# Patient Record
Sex: Male | Born: 1955 | Race: White | Hispanic: No | Marital: Single | Smoking: Never smoker
Health system: Southern US, Community
[De-identification: ages and names within clinical notes are randomized; demographics above are authoritative.]

## PROBLEM LIST (undated history)

## (undated) DIAGNOSIS — T7840XA Allergy, unspecified, initial encounter: Secondary | ICD-10-CM

## (undated) DIAGNOSIS — E785 Hyperlipidemia, unspecified: Secondary | ICD-10-CM

## (undated) HISTORY — DX: Allergy, unspecified, initial encounter: T78.40XA

## (undated) HISTORY — DX: Hyperlipidemia, unspecified: E78.5

## (undated) HISTORY — PX: CERVICAL SPINE SURGERY: SHX589

---

## 2006-10-25 ENCOUNTER — Ambulatory Visit: Payer: Self-pay | Admitting: Gastroenterology

## 2011-05-12 ENCOUNTER — Ambulatory Visit: Payer: Self-pay | Admitting: Family Medicine

## 2011-06-15 ENCOUNTER — Ambulatory Visit: Payer: Self-pay | Admitting: Otolaryngology

## 2012-01-10 ENCOUNTER — Emergency Department: Payer: Self-pay | Admitting: *Deleted

## 2012-01-15 ENCOUNTER — Emergency Department: Payer: Self-pay | Admitting: Emergency Medicine

## 2016-01-05 ENCOUNTER — Emergency Department
Admission: EM | Admit: 2016-01-05 | Discharge: 2016-01-05 | Disposition: A | Discharge status: Home / Self Care | Payer: BLUE CROSS/BLUE SHIELD | Attending: Emergency Medicine | Admitting: Emergency Medicine

## 2016-01-05 ENCOUNTER — Encounter: Payer: Self-pay | Admitting: Emergency Medicine

## 2016-01-05 ENCOUNTER — Emergency Department: Payer: BLUE CROSS/BLUE SHIELD

## 2016-01-05 DIAGNOSIS — S43102A Unspecified dislocation of left acromioclavicular joint, initial encounter: Secondary | ICD-10-CM | POA: Diagnosis not present

## 2016-01-05 DIAGNOSIS — Y999 Unspecified external cause status: Secondary | ICD-10-CM | POA: Insufficient documentation

## 2016-01-05 DIAGNOSIS — Y9351 Activity, roller skating (inline) and skateboarding: Secondary | ICD-10-CM | POA: Diagnosis not present

## 2016-01-05 DIAGNOSIS — W1800XA Striking against unspecified object with subsequent fall, initial encounter: Secondary | ICD-10-CM | POA: Diagnosis not present

## 2016-01-05 DIAGNOSIS — Y929 Unspecified place or not applicable: Secondary | ICD-10-CM | POA: Diagnosis not present

## 2016-01-05 DIAGNOSIS — S4992XA Unspecified injury of left shoulder and upper arm, initial encounter: Secondary | ICD-10-CM | POA: Diagnosis present

## 2016-01-05 MED ORDER — OXYCODONE-ACETAMINOPHEN 5-325 MG PO TABS: 1.0000 | ORAL_TABLET | Freq: Four times a day (QID) | ORAL | Status: DC | PRN

## 2016-01-05 MED ORDER — MELOXICAM 15 MG PO TABS: 15.0000 mg | ORAL_TABLET | Freq: Every day | ORAL | Status: DC

## 2016-01-05 NOTE — ED Notes (Signed)
Pt states he was skating around 6pm tonight when he fell on th left side. Pt was wearing a helmet but did hit his head but states he hit his shoulder harder.  Denies pain right now and states it feels numb. And if he is still it doesn't hurt.

## 2016-01-05 NOTE — ED Provider Notes (Signed)
[facility] Emergency Department Provider Note ____________________________________________  Time seen: Approximately 7:00 PM  I have reviewed the triage vital signs and the nursing notes.   HISTORY  Chief Complaint Fall    HPI Jeremy James is a 60 y.o. male presents to the emergency department for evaluation of left shoulder pain. He was skating this evening and fell. He he was wearing a helmet and denies loss of consciousness although he did strike his head. He denies headache at this time. He states that he landed directly on his shoulder. He states that the shoulder feels numb in the areas of the abrasions.  History reviewed. No pertinent past medical history.  There are no active problems to display for this patient.   Past Surgical History  Procedure Laterality Date  . Cervical spine surgery      Current Outpatient Rx  Name  Route  Sig  Dispense  Refill  . meloxicam (MOBIC) 15 MG tablet   Oral   Take 1 tablet (15 mg total) by mouth daily.   30 tablet   0   . oxyCODONE-acetaminophen (ROXICET) 5-325 MG tablet   Oral   Take 1 tablet by mouth every 6 (six) hours as needed.   20 tablet   0     Allergies Review of patient's allergies indicates no known allergies.  No family history on file.  Social History Social History  Substance Use Topics  . Smoking status: Never Smoker   . Smokeless tobacco: None  . Alcohol Use: No    Review of Systems Constitutional: No recent illness. Cardiovascular: Denies chest pain or palpitations. Respiratory: Denies shortness of breath. Musculoskeletal: Pain in Left shoulder Skin: Negative for rash, wound, lesion. Neurological: Negative for focal weakness or numbness.  ____________________________________________   PHYSICAL EXAM:  VITAL SIGNS: ED Triage Vitals  Enc Vitals Group     BP 01/05/16 1833 129/89 mmHg     Pulse Rate 01/05/16 1833 60     Resp 01/05/16 1833 16     Temp 01/05/16  1833 98.1 F (36.7 C)     Temp Source 01/05/16 1833 Oral     SpO2 01/05/16 1833 97 %     Weight 01/05/16 1833 180 lb (81.647 kg)     Height 01/05/16 1833 6' (1.829 m)     Head Cir --      Peak Flow --      Pain Score 01/05/16 1836 0     Pain Loc --      Pain Edu? --      Excl. in GC? --     Constitutional: Alert and oriented. Well appearing and in no acute distress. Eyes: Conjunctivae are normal. EOMI. Head: Atraumatic. Neck: No stridor.  Respiratory: Normal respiratory effort.   Musculoskeletal: No step-off of the left shoulder on exam although there is very limited range of motion due to pain. There appears to be a deformity at the acromioclavicular joint. Neurologic:  Normal speech and language. No gross focal neurologic deficits are appreciated. Speech is normal. No gait instability. Patient with full range of motion of the elbow and hand. 2 point discrimination intact throughout. Skin:  Skin is warm, dry and intact. Abrasions noted about the left shoulder. Psychiatric: Mood and affect are normal. Speech and behavior are normal.  ____________________________________________   LABS (all labs ordered are listed, but only abnormal results are displayed)  Labs Reviewed - No data to display ____________________________________________  RADIOLOGY  Acromioclavicular joint widening of 9  mm with superior migration of the distal clavicle with respect to the acromion. Coracoclavicular joint space is widened at 31 mm. No definite associated fracture. Glenohumeral alignment is maintained, small glenoid osteophytes. No abnormal soft tissue calcifications. ____________________________________________   PROCEDURES  Procedure(s) performed:   Shoulder immobilizer applied by RN. Patient was neurovascularly intact post-application.   ____________________________________________   INITIAL IMPRESSION / ASSESSMENT AND PLAN / ED COURSE  Pertinent labs & imaging results that were  available during my care of the patient were reviewed by me and considered in my medical decision making (see chart for details).  Strict return precautions discussed with the patient. He was advised to return to the ER if the "numbness" is no longer localized over the abrasions and travels down the arm.  Patient to call orthopedics tomorrow to schedule a follow up. He is to wear the shoulder immobilizer until his follow up is scheduled. He is to return to the ER for symptoms that change or worsen if unable to see the orthopedist right away. ____________________________________________   FINAL CLINICAL IMPRESSION(S) / ED DIAGNOSES  Final diagnoses:  Acromioclavicular joint separation, left, initial encounter       Chinita PesterCari B Aalyssa Elderkin, FNP 01/05/16 2043  Sharyn CreamerMark Quale, MD 01/05/16 [phone]

## 2016-01-05 NOTE — Discharge Instructions (Signed)
Shoulder Separation  A shoulder separation (acromioclavicular separation) is an injury to the connecting tissue (ligament) between the top of your shoulder blade (acromion) and your collarbone (clavicle).  The ligament may be stretched, partially torn, or completely torn.   A stretched ligament may not cause very much pain, and it does not move the collarbone out of place. A stretched ligament looks normal on an X-ray.   An injury that is a bit worse may partially tear a ligament and move the collarbone slightly out of place.   A serious injury completely tears both shoulder ligaments. This moves the collarbone severely out of position and changes the way that the shoulder looks (deformity).  CAUSES  The most common cause of a shoulder separation is falling on or receiving a blow to the top of the shoulder. Falling with an outstretched arm may also cause this injury.  RISK FACTORS  You may be at greater risk of a shoulder separation if:   You are male.   You are younger than age 35.   You play a contact sport, such as football or hockey.  SIGNS AND SYMPTOMS  The most common symptom of a shoulder separation is pain on the top of the shoulder after falling on it or receiving a blow to it. Other signs and symptoms include:   Shoulder deformity.   Swelling of the shoulder.   Decreased ability to move the shoulder.   Bruising on top of the shoulder.  DIAGNOSIS  Your health care provider may suspect a shoulder separation based on your symptoms and the details of a recent injury. A physical exam will be done. During this exam, the health care provider may:   Press on your shoulder.   Test the movement of your shoulder.   Ask you to hold a weight in your hand to see if the separation increases.   Do an X-ray.  TREATMENT   A stretch injury may require only a sling, pain medicine, and cold packs. This treatment may last for 2-12 weeks. You may also have physical therapy. A physical therapist will teach you to  do daily exercises to strengthen your shoulder muscles and prevent stiffness.   A complete tear may require surgery to repair the torn ligament. After surgery, you will also require a sling, pain medicine, and cold packs. Recovery may take longer. You may also need more physical therapy.  HOME CARE INSTRUCTIONS   Take medicines only as directed by your health care provider.   Apply ice to the top of your shoulder:   Put ice in a plastic bag.   Place a towel between your skin and the bag.   Leave the ice on for 20 minutes, 2-3 times a day.   Wear your sling or splint as directed by your health care provider.   You may be able to remove your sling to do your physical therapy exercises.   Ask your health care provider when you can stop wearing the sling.   Do not do any activities that make your pain worse.   Do not lift anything that is heavier than 10 lb (4.5 kg) on the injured side of your body.   Ask your health care provider when you can return to athletic activities.  SEEK MEDICAL CARE IF:   Your pain medicine is not relieving your pain.   Your pain and stiffness are not improving after 2 weeks.   You are unable to do your physical therapy exercises because   of pain or stiffness.     This information is not intended to replace advice given to you by your health care provider. Make sure you discuss any questions you have with your health care provider.     Document Released: 04/01/2005 Document Revised: 07/13/2014 Document Reviewed: 11/22/2013  Elsevier Interactive Patient Education 2016 [employer]

## 2016-01-05 NOTE — ED Notes (Signed)
Patient to the ED via POV for complaint of left shoulder pain s/p fall. Patient states that about 30 minutes he was speed skating and fell landing on the left side. Patient states that he may have hit his head but did not pass out. Patient had helmet on.

## 2017-10-18 ENCOUNTER — Other Ambulatory Visit: Payer: Self-pay

## 2017-10-18 ENCOUNTER — Emergency Department: Payer: BLUE CROSS/BLUE SHIELD

## 2017-10-18 ENCOUNTER — Emergency Department
Admission: EM | Admit: 2017-10-18 | Discharge: 2017-10-18 | Disposition: A | Discharge status: Home / Self Care | Payer: BLUE CROSS/BLUE SHIELD | Attending: Emergency Medicine | Admitting: Emergency Medicine

## 2017-10-18 ENCOUNTER — Encounter: Payer: Self-pay | Admitting: Emergency Medicine

## 2017-10-18 DIAGNOSIS — I8001 Phlebitis and thrombophlebitis of superficial vessels of right lower extremity: Secondary | ICD-10-CM | POA: Insufficient documentation

## 2017-10-18 DIAGNOSIS — Z7901 Long term (current) use of anticoagulants: Secondary | ICD-10-CM | POA: Insufficient documentation

## 2017-10-18 DIAGNOSIS — Z79899 Other long term (current) drug therapy: Secondary | ICD-10-CM | POA: Insufficient documentation

## 2017-10-18 DIAGNOSIS — M79604 Pain in right leg: Secondary | ICD-10-CM | POA: Diagnosis present

## 2017-10-18 LAB — BASIC METABOLIC PANEL
ANION GAP: 4 — AB (ref 5–15)
BUN: 16 mg/dL (ref 6–20)
CHLORIDE: 106 mmol/L (ref 101–111)
CO2: 28 mmol/L (ref 22–32)
Calcium: 9.2 mg/dL (ref 8.9–10.3)
Creatinine, Ser: 0.65 mg/dL (ref 0.61–1.24)
GFR calc non Af Amer: >60 mL/min (ref >60)
Glucose, Bld: 90 mg/dL (ref 65–99)
POTASSIUM: 3.8 mmol/L (ref 3.5–5.1)
Sodium: 138 mmol/L (ref 135–145)

## 2017-10-18 LAB — CBC WITH DIFFERENTIAL/PLATELET
BASOS ABS: 0 10*3/uL (ref 0–0.1)
Basophils Relative: 1 %
Eosinophils Absolute: 0.1 10*3/uL (ref 0–0.7)
Eosinophils Relative: 3 %
HEMATOCRIT: 39 % — AB (ref 40.0–52.0)
Hemoglobin: 13.8 g/dL (ref 13.0–18.0)
LYMPHS PCT: 35 %
Lymphs Abs: 2 10*3/uL (ref 1.0–3.6)
MCH: 33.7 pg (ref 26.0–34.0)
MCHC: 35.4 g/dL (ref 32.0–36.0)
MCV: 95 fL (ref 80.0–100.0)
Monocytes Absolute: 0.7 10*3/uL (ref 0.2–1.0)
Monocytes Relative: 12 %
NEUTROS ABS: 2.8 10*3/uL (ref 1.4–6.5)
NEUTROS PCT: 49 %
Platelets: 204 10*3/uL (ref 150–440)
RBC: 4.1 MIL/uL — AB (ref 4.40–5.90)
RDW: 13 % (ref 11.5–14.5)
WBC: 5.5 10*3/uL (ref 3.8–10.6)

## 2017-10-18 LAB — PROTIME-INR
INR: 0.96
Prothrombin Time: 12.7 seconds (ref 11.4–15.2)

## 2017-10-18 MED ORDER — APIXABAN 5 MG PO TABS
10.0000 mg | ORAL_TABLET | Freq: Once | ORAL | Status: AC
  Administered 2017-10-18: 10 mg via ORAL
  Filled 2017-10-18: qty 2

## 2017-10-18 MED ORDER — APIXABAN 5 MG PO TABS: ORAL_TABLET | ORAL | 0 refills | Status: DC

## 2017-10-18 NOTE — Discharge Instructions (Addendum)
You have a clot in a superficial vein in your leg (the saphenous vein), however it is a fairly large clot and extends almost to the deep veins so we are giving you a blood thinner to treat it the same way as a deep vein thrombosis.  Take the blood thinner as prescribed.  Make an appointment to follow-up with your primary care doctor within the next 1-2 weeks.  Consult your primary care doctor to decide on the appropriate time to return to physical activity.  Return to the ER for new, worsening, or persistent pain or swelling, difficulty walking, shortness of breath, chest pain, or any other new or worsening symptoms that concern you.

## 2017-10-18 NOTE — ED Notes (Signed)
Esign not working pt verbalized discharge instructions and has no questions at this time 

## 2017-10-18 NOTE — ED Triage Notes (Signed)
Right lower leg pain x 2 weeks, since colonoscopy.  Sent by PCP to evaluate for DVT.

## 2017-10-18 NOTE — ED Notes (Signed)
Pt has slight swelling and tenderness not to right leg

## 2017-10-18 NOTE — ED Provider Notes (Signed)
[facility] Emergency Department Provider Note ____________________________________________   First MD Initiated Contact with Patient 10/18/17 1743     (approximate)  I have reviewed the triage vital signs and the nursing notes.   HISTORY  Chief Complaint Leg Pain    HPI Jeremy James is a 62 y.o. male with PMH as noted below who presents with right lower extremity swelling and pain for the last few weeks, occurring after he had a colonoscopy, described by him as a "varicose vein" and primarily localized to an area to the right lower leg just past the knee.  No history of trauma.  Patient was referred to the emergency department for evaluation for DVT.  History reviewed. No pertinent past medical history.  There are no active problems to display for this patient.   Past Surgical History:  Procedure Laterality Date  . CERVICAL SPINE SURGERY      Prior to Admission medications   Medication Sig Start Date End Date Taking? Authorizing Provider  apixaban (ELIQUIS) 5 MG TABS tablet Take 2 tablets (10 mg total) by mouth 2 (two) times daily for 7 days, THEN 1 tablet (5 mg total) 2 (two) times daily for 21 days. 10/19/17 11/16/17  Dionne BucySiadecki, Laquashia Mergenthaler, MD  meloxicam (MOBIC) 15 MG tablet Take 1 tablet (15 mg total) by mouth daily. 01/05/16   Triplett, Cari B, FNP  oxyCODONE-acetaminophen (ROXICET) 5-325 MG tablet Take 1 tablet by mouth every 6 (six) hours as needed. 01/05/16   Chinita Pesterriplett, Cari B, FNP    Allergies Patient has no known allergies.  No family history on file.  Social History Social History   Tobacco Use  . Smoking status: Never Smoker  . Smokeless tobacco: Never Used  Substance Use Topics  . Alcohol use: No  . Drug use: Not on file    Review of Systems  Constitutional: No fever/chills. Cardiovascular: Denies chest pain. Respiratory: Denies shortness of breath. Gastrointestinal: No vomiting. Musculoskeletal: Positive for right lower leg  pain. Skin: Negative for rash. Neurological: Negative for focal weakness or numbness.   ____________________________________________   PHYSICAL EXAM:  VITAL SIGNS: ED Triage Vitals  Enc Vitals Group     BP 10/18/17 1506 (!) 150/71     Pulse Rate 10/18/17 1506 (!) 54     Resp 10/18/17 1506 16     Temp 10/18/17 1506 97.9 F (36.6 C)     Temp Source 10/18/17 1506 Oral     SpO2 10/18/17 1506 97 %     Weight 10/18/17 1504 182 lb (82.6 kg)     Height 10/18/17 1504 6' (1.829 m)     Head Circumference --      Peak Flow --      Pain Score 10/18/17 1503 2     Pain Loc --      Pain Edu? --      Excl. in GC? --     Constitutional: Alert and oriented. Well appearing and in no acute distress. Eyes: Conjunctivae are normal.  Head: Atraumatic. Nose: No congestion/rhinnorhea. Mouth/Throat: Mucous membranes are moist.   Neck: Normal range of motion.  Cardiovascular: Good peripheral circulation. Respiratory: Normal respiratory effort.   Gastrointestinal: No distention.  Musculoskeletal: No lower extremity edema.  Extremities warm and well perfused.  Right lower leg with approximately 5 cm area of mild swelling and tenderness just distal to the knee in the medial aspect of the leg.  No popliteal or proximal leg swelling or tenderness.   Neurologic:  Normal  speech and language. No gross focal neurologic deficits are appreciated.  Skin:  Skin is warm and dry. No rash noted. Psychiatric: Mood and affect are normal. Speech and behavior are normal.  ____________________________________________   LABS (all labs ordered are listed, but only abnormal results are displayed)  Labs Reviewed  BASIC METABOLIC PANEL - Abnormal; Notable for the following components:      Result Value   Anion gap 4 (*)    All other components within normal limits  CBC WITH DIFFERENTIAL/PLATELET - Abnormal; Notable for the following components:   RBC 4.10 (*)    HCT 39.0 (*)    All other components within normal  limits  PROTIME-INR   ____________________________________________  EKG   ____________________________________________  RADIOLOGY  [place] RLE: superficial thrombophlebitis of the right great saphenous vein extending to the saphenofemoral junction  ____________________________________________   PROCEDURES  Procedure(s) performed: No  Procedures  Critical Care performed: No ____________________________________________   INITIAL IMPRESSION / ASSESSMENT AND PLAN / ED COURSE  Pertinent labs & imaging results that were available during my care of the patient were reviewed by me and considered in my medical decision making (see chart for details).  62 year old male with PMH as noted above presents with right lower extremity pain for the last several weeks after having colonoscopy, concern for possible DVT.  On exam, the patient is well-appearing, vital signs are normal except for hypertension, and the remainder the exam is as described above.  Ultrasound shows superficial thrombophlebitis of the saphenous vein extending to the saphenofemoral junction.  Although this is a superficial vein, given that it is relatively extensive and approaches the deep venous system, based on increased risk of VT E, anticoagulation is recommended.  There is no evidence of infection or indication for antibiotics.  Plan: Baseline labs, and discharge with prescription for Eliquis and primary care follow-up.  Clinical Course as of Oct 19 1931  Mon Oct 18, 2017  [address] [facility]: 33.7 [SS]    Clinical Course User Index [SS] Dionne Bucy, MD   ----------------------------------------- 7:33 PM on 10/18/2017 -----------------------------------------  Lab workup is unremarkable.  Patient stable for discharge home.  He agrees to follow-up with his primary care doctor.  Return precautions given, and the patient expresses understanding.  ____________________________________________   FINAL CLINICAL  IMPRESSION(S) / ED DIAGNOSES  Final diagnoses:  Thrombophlebitis of superficial veins of right lower extremity      NEW MEDICATIONS STARTED DURING THIS VISIT:  New Prescriptions   APIXABAN (ELIQUIS) 5 MG TABS TABLET    Take 2 tablets (10 mg total) by mouth 2 (two) times daily for 7 days, THEN 1 tablet (5 mg total) 2 (two) times daily for 21 days.     Note:  This document was prepared using Dragon voice recognition software and may include unintentional dictation errors.    Dionne Bucy, MD 10/18/17 [phone]

## 2017-11-15 ENCOUNTER — Ambulatory Visit (INDEPENDENT_AMBULATORY_CARE_PROVIDER_SITE_OTHER): Payer: BLUE CROSS/BLUE SHIELD | Admitting: Vascular Surgery

## 2017-11-15 ENCOUNTER — Encounter (INDEPENDENT_AMBULATORY_CARE_PROVIDER_SITE_OTHER): Payer: Self-pay | Admitting: Vascular Surgery

## 2017-11-15 DIAGNOSIS — E782 Mixed hyperlipidemia: Secondary | ICD-10-CM | POA: Diagnosis not present

## 2017-11-15 DIAGNOSIS — I8001 Phlebitis and thrombophlebitis of superficial vessels of right lower extremity: Secondary | ICD-10-CM | POA: Diagnosis not present

## 2017-11-15 DIAGNOSIS — E785 Hyperlipidemia, unspecified: Secondary | ICD-10-CM | POA: Insufficient documentation

## 2017-11-15 DIAGNOSIS — M1990 Unspecified osteoarthritis, unspecified site: Secondary | ICD-10-CM | POA: Insufficient documentation

## 2017-11-15 DIAGNOSIS — M1991 Primary osteoarthritis, unspecified site: Secondary | ICD-10-CM | POA: Diagnosis not present

## 2017-11-15 NOTE — Progress Notes (Signed)
MRN : [id number]  Jeremy James is a 62 y.o. (1956/02/20) male who presents with chief complaint of No chief complaint on file. Marland Kitchen  History of Present Illness:   I am asked to evaluate the patient for superficial thrombophlebitis of the right leg.  Patient estimates this began 4 weeks ago.  He underwent elective colonoscopy and the following day noted pain in his right leg medially.  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  He has never had any problems.  They had not caused him pain in the past.  Because of the increasing pain tenderness and what he describes as the hardness he saw his primary physician.  He was subsequently referred to the emergency room where an ultrasound was obtained which demonstrated superficial thrombophlebitis of the great saphenous vein all the way up to the saphenofemoral junction.  Because of this he was started on Eliquis.  He states the Eliquis makes him feel somewhat lightheaded "almost like vertigo".  He is questioning how long he will have to be on Eliquis.  No outpatient medications have been marked as taking for the 11/15/17 encounter (Appointment) with Gilda Crease, Latina Craver, MD.    No past medical history on file.  Past Surgical History:  Procedure Laterality Date  . CERVICAL SPINE SURGERY      Social History Social History   Tobacco Use  . Smoking status: Never Smoker  . Smokeless tobacco: Never Used  Substance Use Topics  . Alcohol use: No  . Drug use: Not on file    Family History No family history on file. No family history of bleeding/clotting disorders, porphyria or autoimmune disease   No Known Allergies   REVIEW OF SYSTEMS (Negative unless checked)  Constitutional: Weight loss  Fever  Chills Cardiac: Chest pain   Chest pressure   Palpitations   Shortness of breath when laying flat   Shortness of breath with exertion. Vascular:   Pain in legs with walking   Pain in legs at rest  History of DVT   Phlebitis   Swelling in legs   Varicose veins   Non-healing ulcers Pulmonary:   Uses home oxygen   Productive cough   Hemoptysis   Wheeze  COPD   Asthma Neurologic:  Dizziness   Seizures   History of stroke   History of TIA  Aphasia   Vissual changes   Weakness or numbness in arm   Weakness or numbness in leg Musculoskeletal:   Joint swelling   Joint pain   Low back pain Hematologic:  Easy bruising  Easy bleeding   Hypercoagulable state   Anemic Gastrointestinal:  Diarrhea   Vomiting  Gastroesophageal reflux/heartburn   Difficulty swallowing. Genitourinary:  Chronic kidney disease   Difficult urination  Frequent urination   Blood in urine Skin:  Rashes   Ulcers  Psychological:  History of anxiety    History of major depression.  Physical Examination  There were no vitals filed for this visit. There is no height or weight on file to calculate BMI. Gen: WD/WN, NAD Head: [place]/AT, No temporalis wasting.  Ear/Nose/Throat: Hearing grossly intact, nares w/o erythema or drainage, poor dentition Eyes: PER, EOMI, sclera nonicteric.  Neck: Supple, no masses.  No bruit or JVD.  Pulmonary:  Good air movement, clear to auscultation bilaterally, no use of accessory muscles.  Cardiac: RRR, normal S1, S2, no Murmurs. Vascular: Large  varicosities of the medial right calf.  Easily palpable cord noted in the great saphenous vein distribution.  Mild to moderately tender.  Mild venous changes to the skin of the ankle on the right. Vessel Right Left  Radial Palpable Palpable  Gastrointestinal: soft, non-distended. No guarding/no peritoneal signs.  Musculoskeletal: M/S 5/5 throughout.  No deformity or atrophy.  Neurologic: CN 2-12 intact. Pain and light touch intact in extremities.  Symmetrical.  Speech is fluent. Motor exam as listed above. Psychiatric: Judgment  intact, Mood & affect appropriate for pt's clinical situation. Dermatologic: Mild venous rashes right ankle no ulcers noted.  No changes consistent with cellulitis. Lymph : No Cervical lymphadenopathy, no lichenification or skin changes of chronic lymphedema.  CBC Lab Results  Component Value Date   WBC 5.5 10/18/2017   HGB 13.8 10/18/2017   HCT 39.0 (L) 10/18/2017   MCV 95.0 10/18/2017   PLT 204 10/18/2017    BMET    Component Value Date/Time   NA 138 10/18/2017 1827   K 3.8 10/18/2017 1827   CL 106 10/18/2017 1827   CO2 28 10/18/2017 1827   GLUCOSE 90 10/18/2017 1827   BUN 16 10/18/2017 1827   CREATININE 0.65 10/18/2017 1827   CALCIUM 9.2 10/18/2017 1827   GFRNONAA >60 10/18/2017 1827   GFRAA >60 10/18/2017 1827   CrCl cannot be calculated (Patient's most recent lab result is older than the maximum 21 days allowed.).  COAG Lab Results  Component Value Date   INR 0.96 10/18/2017    Radiology US Venous Img Lower Unilateral Right  Result Date: 10/18/2017 CLINICAL DATA:  Pain x2 weeks post colonoscopy EXAM: RIGHT LOWER EXTREMITY VENOUS DOPPLER ULTRASOUND TECHNIQUE: Gray-scale sonography with compression, as well as color and duplex ultrasound, were performed to evaluate the deep venous system from the level of the common femoral vein through the popliteal and proximal calf veins. COMPARISON:  None FINDINGS: Normal compressibility of the common femoral, superficial femoral, and popliteal veins, as well as the proximal calf veins. No filling defects to suggest DVT on grayscale or color Doppler imaging. Doppler waveforms show normal direction of venous flow, normal respiratory phasicity and response to augmentation. There is occlusive thrombosis of the right great saphenous vein extending from above the ankle nearly to the saphenofemoral junction. Survey views of the contralateral common femoral vein are unremarkable. IMPRESSION: 1.  No evidence of RIGHT lower extremity deep vein  thrombosis. 2. Extensive superficial thrombophlebitis involving the right great saphenous vein. Electronically Signed   By: Corlis Leak M.D.   On: 10/18/2017 16:07    Assessment/Plan 1. Superficial thrombophlebitis of right leg Recommend:   No surgery or intervention at this point in time.  IVC filter is not indicated at present.  Patient's duplex ultrasound of the venous system shows STP from the calf upto the SFJ.  The patient is initiated on anticoagulation   Elevation was stressed.  I have had a long discussion with the patient regarding STP and post phlebitic changes such as swelling and why it  causes symptoms such as pain.  Various options for anticoagulation were reviewed with the patient given the lightheadedness he has been experiencing while on Eliquis.  At this point he has been on anti-coagulation for approximately 4 weeks and only has another 4 to 6 weeks to go.  Given this and the typical course that being on Coumadin entails he is elected to continue the Eliquis.  He is a competitive Education officer, museum as a hobby and we discussed  this in depth.  I informed him that doing some light workouts would be fine however competition but would be out of the question given the risk for falls and bleeding while on anticoagulation.  Again the discussion centered on the fact that there is nothing inherently wrong with skating but it where he to be at a risk for falling such as aggressive training or actual competition that that would then be contraindicated.  The patient will continue anticoagulation for now as there have not been any problems or complications at this point.  I will see him back in 1 month with a follow-up ultrasound and we will hopefully be able to stop his anticoagulation  - VAS [place] LOWER EXTREMITY VENOUS REFLUX; Future  2. Primary osteoarthritis, unspecified site Continue NSAID medications as already ordered, these medications have been reviewed and there are no changes at this  time.  Continued activity and therapy was stressed.   3. Mixed hyperlipidemia Continue statin as ordered and reviewed, no changes at this time   Levora Dredge, MD  11/15/2017 8:56 AM

## 2017-12-16 ENCOUNTER — Ambulatory Visit (INDEPENDENT_AMBULATORY_CARE_PROVIDER_SITE_OTHER): Payer: BLUE CROSS/BLUE SHIELD

## 2017-12-16 ENCOUNTER — Ambulatory Visit (INDEPENDENT_AMBULATORY_CARE_PROVIDER_SITE_OTHER): Payer: BLUE CROSS/BLUE SHIELD | Admitting: Vascular Surgery

## 2017-12-16 ENCOUNTER — Encounter (INDEPENDENT_AMBULATORY_CARE_PROVIDER_SITE_OTHER): Payer: Self-pay | Admitting: Vascular Surgery

## 2017-12-16 VITALS — BP 129/88 | HR 54 | Resp 12 | Ht 72.0 in | Wt 187.0 lb

## 2017-12-16 DIAGNOSIS — I8001 Phlebitis and thrombophlebitis of superficial vessels of right lower extremity: Secondary | ICD-10-CM

## 2017-12-16 DIAGNOSIS — E782 Mixed hyperlipidemia: Secondary | ICD-10-CM

## 2017-12-17 ENCOUNTER — Encounter (INDEPENDENT_AMBULATORY_CARE_PROVIDER_SITE_OTHER): Payer: Self-pay | Admitting: Vascular Surgery

## 2017-12-17 NOTE — Progress Notes (Signed)
MRN : [id number]  Jeremy James is a 62 y.o. (1956/04/20) male who presents with chief complaint of  Chief Complaint  Patient presents with  . Follow-up    1 month Right reflux f/u  .  History of Present Illness:    Patient returns for office for follow-up regarding right superficial thrombophlebitis of the right leg.  An ultrasound was obtained which demonstrated superficial thrombophlebitis of the great saphenous vein all the way up to the saphenofemoral junction.  Because of this he was started on Eliquis.  He states the Eliquis makes him feel somewhat lightheaded "almost like vertigo".    Over the past 4 weeks he has done well he has had no further problems with Eliquis.  The pain in his right leg is resolved.  The watching  Current Meds  Medication Sig  . apixaban (ELIQUIS) 5 MG TABS tablet Take 5 mg by mouth 2 (two) times daily.  . cetirizine (ZYRTEC) 10 MG tablet Take by mouth.  . Coconut Oil 1000 MG CAPS Take by mouth.  . diphenhydrAMINE (BENADRYL) 25 mg capsule Take by mouth.  . lovastatin (MEVACOR) 20 MG tablet Take by mouth.  . Melatonin 1 MG TABS Take by mouth.  . meloxicam (MOBIC) 15 MG tablet Take 1 tablet (15 mg total) by mouth daily.  . Omega-3 Fatty Acids (RA FISH OIL) 1000 MG CAPS Take by mouth.  . oxyCODONE-acetaminophen (ROXICET) 5-325 MG tablet Take 1 tablet by mouth every 6 (six) hours as needed.    Past Medical History:  Diagnosis Date  . Allergy   . Hyperlipidemia     Past Surgical History:  Procedure Laterality Date  . CERVICAL SPINE SURGERY      Social History Social History   Tobacco Use  . Smoking status: Never Smoker  . Smokeless tobacco: Never Used  Substance Use Topics  . Alcohol use: No  . Drug use: Not on file    Family History Family History  Problem Relation Age of Onset  . Hyperlipidemia Mother   . Alzheimer's disease Mother     No Known Allergies   REVIEW OF SYSTEMS (Negative unless checked)  Constitutional:  [] Weight loss  [] Fever  [] Chills Cardiac: [] Chest pain   [] Chest pressure   [] Palpitations   [] Shortness of breath when laying flat   [] Shortness of breath with exertion. Vascular:  [] Pain in legs with walking   [] Pain in legs at rest  [] History of DVT   [] Phlebitis   [x] Swelling in legs   [x] Varicose veins   [] Non-healing ulcers Pulmonary:   [] Uses home oxygen   [] Productive cough   [] Hemoptysis   [] Wheeze  [] COPD   [] Asthma Neurologic:  [] Dizziness   [] Seizures   [] History of stroke   [] History of TIA  [] Aphasia   [] Vissual changes   [] Weakness or numbness in arm   [] Weakness or numbness in leg Musculoskeletal:   [] Joint swelling   [] Joint pain   [] Low back pain Hematologic:  [] Easy bruising  [] Easy bleeding   [] Hypercoagulable state   [] Anemic Gastrointestinal:  [] Diarrhea   [] Vomiting  [] Gastroesophageal reflux/heartburn   [] Difficulty swallowing. Genitourinary:  [] Chronic kidney disease   [] Difficult urination  [] Frequent urination   [] Blood in urine Skin:  [] Rashes   [] Ulcers  Psychological:  [] History of anxiety   []  History of major depression.  Physical Examination  Vitals:   12/16/17 0902  BP: 129/88  Pulse: (!) 54  Resp: 12  Weight: 187 lb (84.8 kg)  Height: 6' (  1.829 m)   Body mass index is 25.36 kg/m. Gen: WD/WN, NAD Head: [place]/AT, No temporalis wasting.  Ear/Nose/Throat: Hearing grossly intact, nares w/o erythema or drainage Eyes: PER, EOMI, sclera nonicteric.  Neck: Supple, no large masses.   Pulmonary:  Good air movement, no audible wheezing bilaterally, no use of accessory muscles.  Cardiac: RRR, no JVD Vascular: Large varicosities with areas of palpable acute changes of the right lower extremity Vessel Right Left  Radial Palpable Palpable  PT Palpable Palpable  DP Palpable Palpable  Gastrointestinal: Non-distended. No guarding/no peritoneal signs.  Musculoskeletal: M/S 5/5 throughout.  No deformity or atrophy.  Neurologic: CN 2-12 intact. Symmetrical.  Speech is  fluent. Motor exam as listed above. Psychiatric: Judgment intact, Mood & affect appropriate for pt's clinical situation. Dermatologic: Mild venous rashes no ulcers noted.  No changes consistent with cellulitis. Lymph : No lichenification or skin changes of chronic lymphedema.  CBC Lab Results  Component Value Date   WBC 5.5 10/18/2017   HGB 13.8 10/18/2017   HCT 39.0 (L) 10/18/2017   MCV 95.0 10/18/2017   PLT 204 10/18/2017    BMET    Component Value Date/Time   NA 138 10/18/2017 1827   K 3.8 10/18/2017 1827   CL 106 10/18/2017 1827   CO2 28 10/18/2017 1827   GLUCOSE 90 10/18/2017 1827   BUN 16 10/18/2017 1827   CREATININE 0.65 10/18/2017 1827   CALCIUM 9.2 10/18/2017 1827   GFRNONAA >60 10/18/2017 1827   GFRAA >60 10/18/2017 1827   CrCl cannot be calculated (Patient's most recent lab result is older than the maximum 21 days allowed.).  COAG Lab Results  Component Value Date   INR 0.96 10/18/2017    Radiology No results found.   Assessment/Plan 1. Superficial thrombophlebitis of right leg Recommend:   No surgery or intervention at this point in time.  IVC filter is not indicated at present.  Patient's duplex ultrasound of the venous system shows  resolution of the thrombus near the saphenofemoral junction there is recanalization of the second to the no evidence of DVT [address] is.  Patient will discontinue his anticoagulation  On January elevation was stressed.   - VAS [place] LOWER EXTREMITY VENOUS REFLUX; Future  2. Primary osteoarthritis, unspecified site Continue NSAID medications as already ordered, these medications have been reviewed and there are no changes at this time.  Continued activity and therapy was stressed.   3. Mixed hyperlipidemia Continue statin as ordered and reviewed, no changes at this time    Levora DredgeGregory Schnier, MD  12/17/2017 7:52 AM

## 2018-06-23 ENCOUNTER — Ambulatory Visit (INDEPENDENT_AMBULATORY_CARE_PROVIDER_SITE_OTHER): Payer: BLUE CROSS/BLUE SHIELD | Admitting: Vascular Surgery

## 2018-06-23 ENCOUNTER — Encounter (INDEPENDENT_AMBULATORY_CARE_PROVIDER_SITE_OTHER): Payer: Self-pay | Admitting: Vascular Surgery

## 2018-06-23 ENCOUNTER — Ambulatory Visit (INDEPENDENT_AMBULATORY_CARE_PROVIDER_SITE_OTHER): Payer: BLUE CROSS/BLUE SHIELD

## 2018-06-23 VITALS — BP 129/77 | HR 54 | Resp 12 | Ht 72.0 in | Wt 192.8 lb

## 2018-06-23 DIAGNOSIS — M1991 Primary osteoarthritis, unspecified site: Secondary | ICD-10-CM | POA: Diagnosis not present

## 2018-06-23 DIAGNOSIS — E782 Mixed hyperlipidemia: Secondary | ICD-10-CM

## 2018-06-23 DIAGNOSIS — I8001 Phlebitis and thrombophlebitis of superficial vessels of right lower extremity: Secondary | ICD-10-CM

## 2018-06-23 NOTE — Progress Notes (Signed)
MRN : [id number]  Jeremy James is a 62 y.o. (1955/12/04) male who presents with chief complaint of No chief complaint on file. Marland Kitchen.  History of Present Illness:   Patient returns for office for 6 follow-up regarding right superficial thrombophlebitis of the right leg. An ultrasound was obtained which demonstrated superficial thrombophlebitis of the great saphenous vein all the way up to the saphenofemoral junction. Because of this he was started on Eliquis.  The pain in his right leg is resolved.  He stopped his Eliquis in June, 2019  No outpatient medications have been marked as taking for the 06/23/18 encounter (Appointment) with Gilda CreaseSchnier, Latina CraverGregory G, MD.    Past Medical History:  Diagnosis Date  . Allergy   . Hyperlipidemia     Past Surgical History:  Procedure Laterality Date  . CERVICAL SPINE SURGERY      Social History Social History   Tobacco Use  . Smoking status: Never Smoker  . Smokeless tobacco: Never Used  Substance Use Topics  . Alcohol use: No  . Drug use: Not on file    Family History Family History  Problem Relation Age of Onset  . Hyperlipidemia Mother   . Alzheimer's disease Mother     No Known Allergies   REVIEW OF SYSTEMS (Negative unless checked)  Constitutional: [] Weight loss  [] Fever  [] Chills Cardiac: [] Chest pain   [] Chest pressure   [] Palpitations   [] Shortness of breath when laying flat   [] Shortness of breath with exertion. Vascular:  [] Pain in legs with walking   [] Pain in legs at rest  [] History of DVT   [] Phlebitis   [x] Swelling in legs   [x] Varicose veins   [] Non-healing ulcers Pulmonary:   [] Uses home oxygen   [] Productive cough   [] Hemoptysis   [] Wheeze  [] COPD   [] Asthma Neurologic:  [] Dizziness   [] Seizures   [] History of stroke   [] History of TIA  [] Aphasia   [] Vissual changes   [] Weakness or numbness in arm   [] Weakness or numbness in leg Musculoskeletal:   [] Joint swelling   [] Joint pain   [] Low back pain Hematologic:   [] Easy bruising  [] Easy bleeding   [] Hypercoagulable state   [] Anemic Gastrointestinal:  [] Diarrhea   [] Vomiting  [] Gastroesophageal reflux/heartburn   [] Difficulty swallowing. Genitourinary:  [] Chronic kidney disease   [] Difficult urination  [] Frequent urination   [] Blood in urine Skin:  [] Rashes   [] Ulcers  Psychological:  [] History of anxiety   []  History of major depression.  Physical Examination  There were no vitals filed for this visit. There is no height or weight on file to calculate BMI. Gen: WD/WN, NAD Head: [place]/AT, No temporalis wasting.  Ear/Nose/Throat: Hearing grossly intact, nares w/o erythema or drainage Eyes: PER, EOMI, sclera nonicteric.  Neck: Supple, no large masses.   Pulmonary:  Good air movement, no audible wheezing bilaterally, no use of accessory muscles.  Cardiac: RRR, no JVD Vascular: Large varicosities present extensively greater than 10 mm right > left.  Mild venous stasis changes to the legs bilaterally.  2+ soft pitting edema Vessel Right Left  Radial Palpable Palpable  PT Palpable Palpable  DP Palpable Palpable  Gastrointestinal: Non-distended. No guarding/no peritoneal signs.  Musculoskeletal: M/S 5/5 throughout.  No deformity or atrophy.  Neurologic: CN 2-12 intact. Symmetrical.  Speech is fluent. Motor exam as listed above. Psychiatric: Judgment intact, Mood & affect appropriate for pt's clinical situation. Dermatologic: No rashes or ulcers noted.  No changes consistent with cellulitis. Lymph : No lichenification  or skin changes of chronic lymphedema.  CBC Lab Results  Component Value Date   WBC 5.5 10/18/2017   HGB 13.8 10/18/2017   HCT 39.0 (L) 10/18/2017   MCV 95.0 10/18/2017   PLT 204 10/18/2017    BMET    Component Value Date/Time   NA 138 10/18/2017 1827   K 3.8 10/18/2017 1827   CL 106 10/18/2017 1827   CO2 28 10/18/2017 1827   GLUCOSE 90 10/18/2017 1827   BUN 16 10/18/2017 1827   CREATININE 0.65 10/18/2017 1827   CALCIUM 9.2  10/18/2017 1827   GFRNONAA >60 10/18/2017 1827   GFRAA >60 10/18/2017 1827   CrCl cannot be calculated (Patient's most recent lab result is older than the maximum 21 days allowed.).  COAG Lab Results  Component Value Date   INR 0.96 10/18/2017    Radiology No results found.   Assessment/Plan 1. Superficial thrombophlebitis of right leg Recommend:  The patient is complaining of varicose veins.    I have had a long discussion with the patient regarding  varicose veins and why they cause symptoms.  Patient will begin wearing graduated compression stockings on a daily basis, beginning first thing in the morning and removing them in the evening. The patient is instructed specifically not to sleep in the stockings.    The patient  will also begin using over-the-counter analgesics such as Motrin 600 mg po TID to help control the symptoms as needed.    In addition, behavioral modification including elevation during the day will be initiated, utilizing a recliner was recommended.  The patient is also instructed to continue exercising such as walking 4-5 times per week.  At this time the patient wishes to continue conservative therapy and is not interested in more invasive treatments such as laser ablation and sclerotherapy.  The Patient will follow up PRN if the symptoms worsen.  2. Primary osteoarthritis, unspecified site Continue NSAID medications as already ordered, these medications have been reviewed and there are no changes at this time.  Continued activity and therapy was stressed.   3. Mixed hyperlipidemia Continue statin as ordered and reviewed, no changes at this time     Levora DredgeGregory , MD  06/23/2018 8:26 AM

## 2019-03-25 IMAGING — US US EXTREM LOW VENOUS*R*
1 series · 14 of 24 positions shown · non-contrast
Comparison: None

CLINICAL DATA: Pain x2 weeks post colonoscopy

EXAM:
RIGHT LOWER EXTREMITY VENOUS DOPPLER ULTRASOUND
TECHNIQUE: Gray-scale sonography with compression, as well as color and duplex
ultrasound, were performed to evaluate the deep venous system from
the level of the common femoral vein through the popliteal and
proximal calf veins.

[Series 1: us extrem low venous*right* · 0.07mm/px · 14 of 49 slices shown]
[im 1/49]
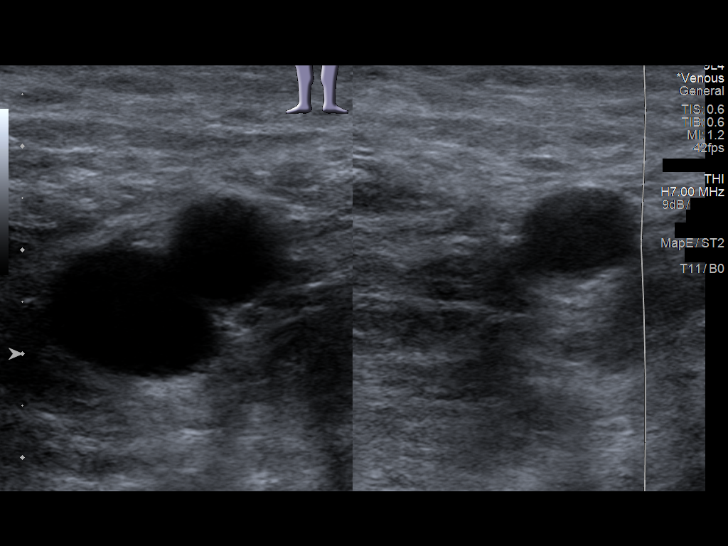
[im 5/49]
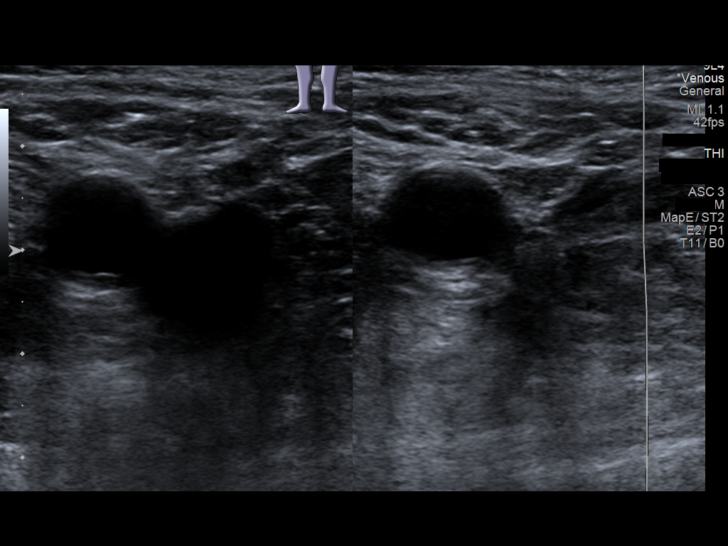
[im 9/49]
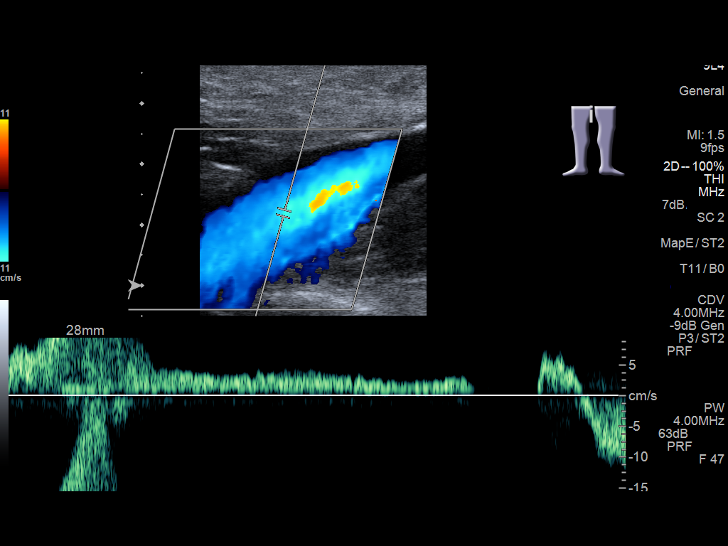
[im 13/49]
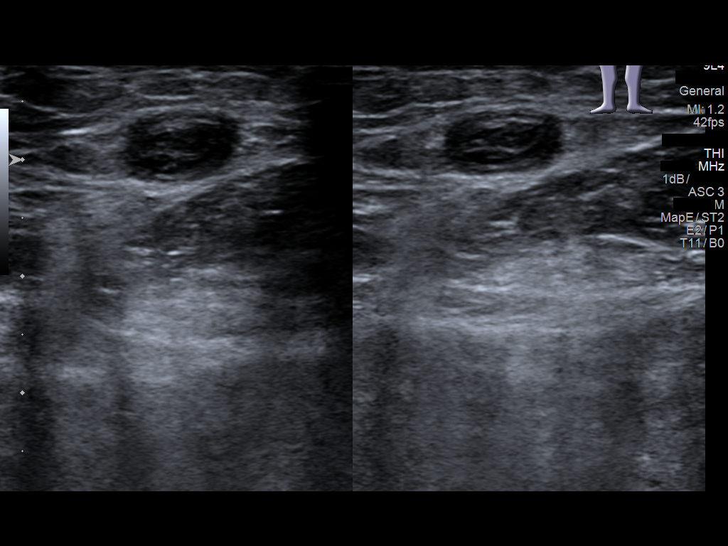
[im 15/49]
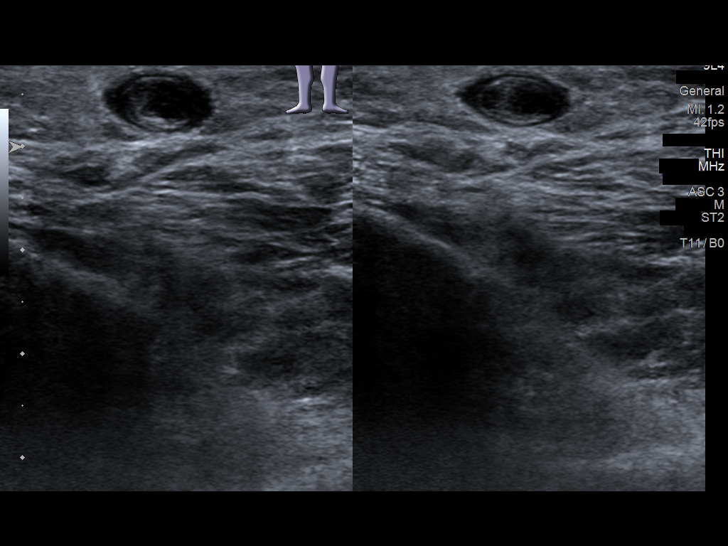
[im 19/49]
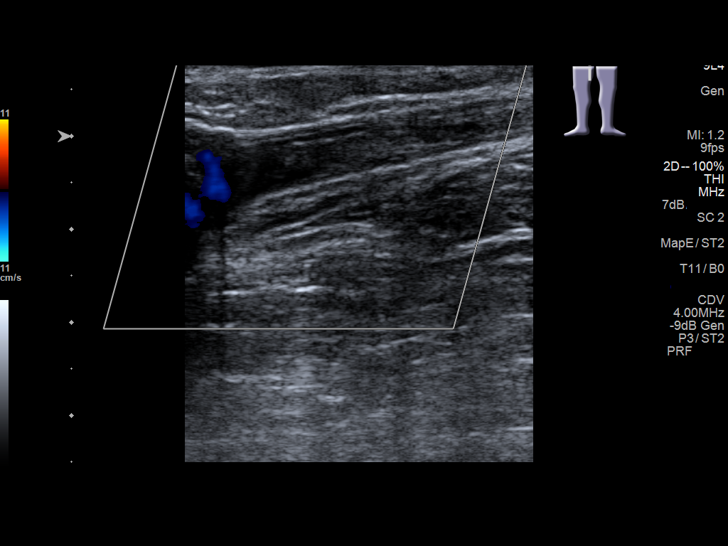
[im 23/49]
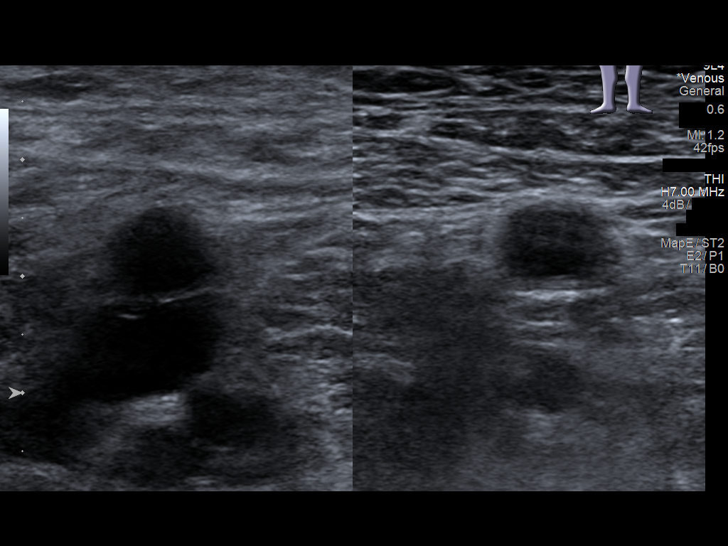
[im 26/49]
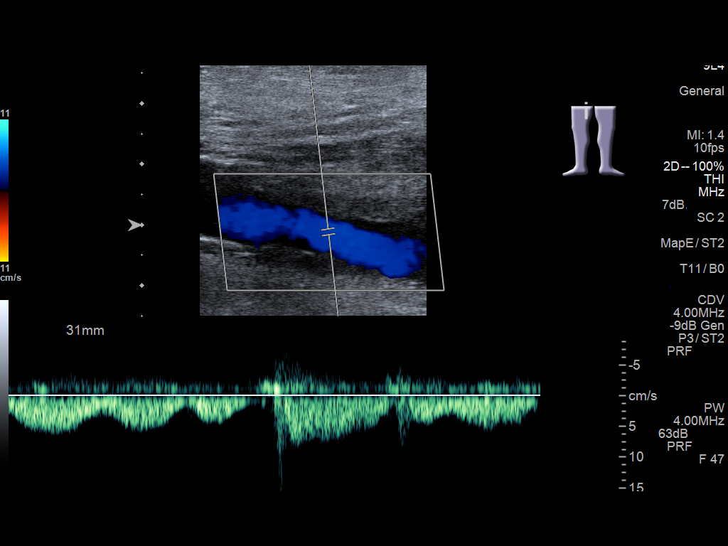
[im 30/49]
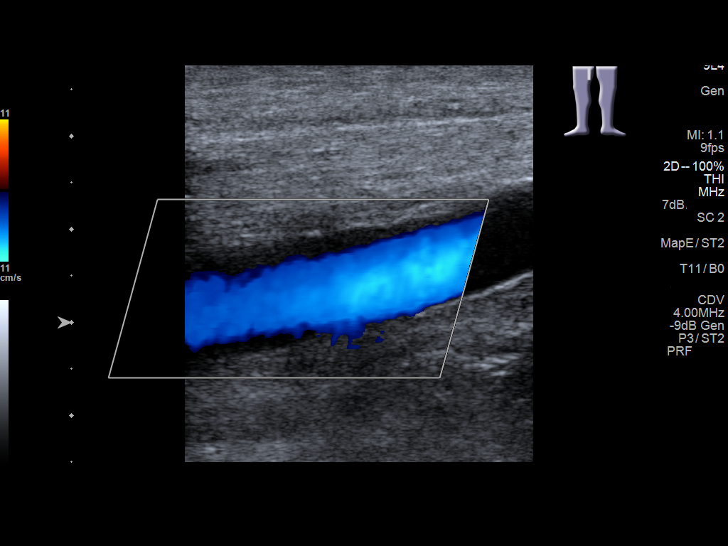
[im 34/49]
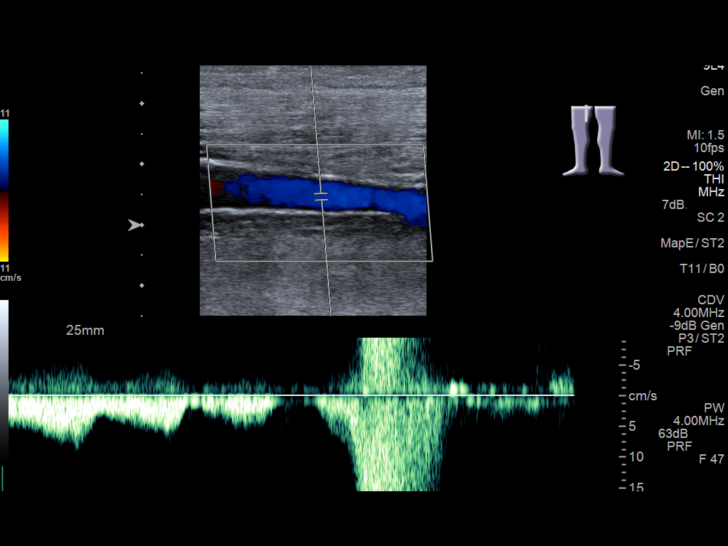
[im 38/49]
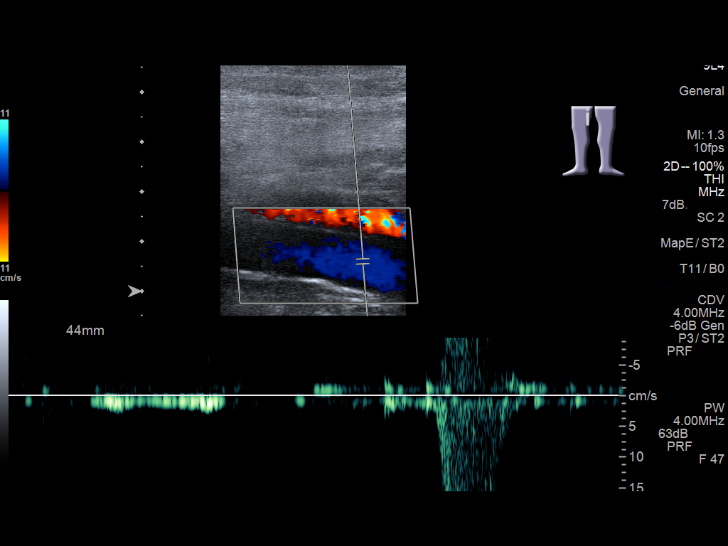
[im 40/49]
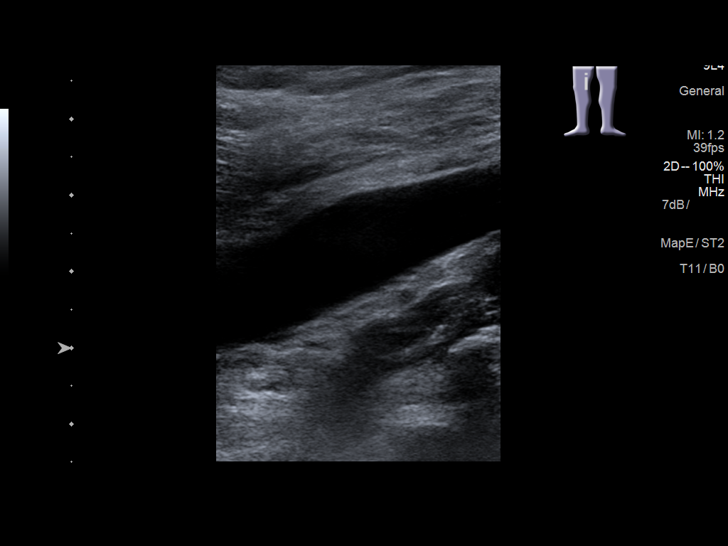
[im 44/49]
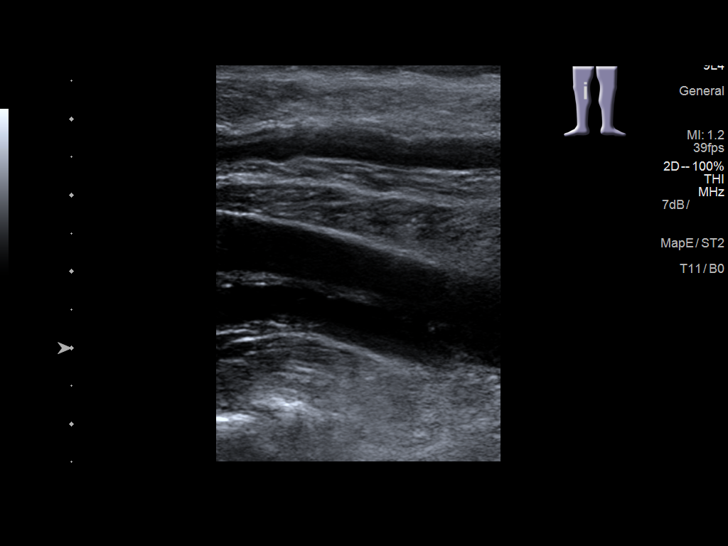
[im 49/49]
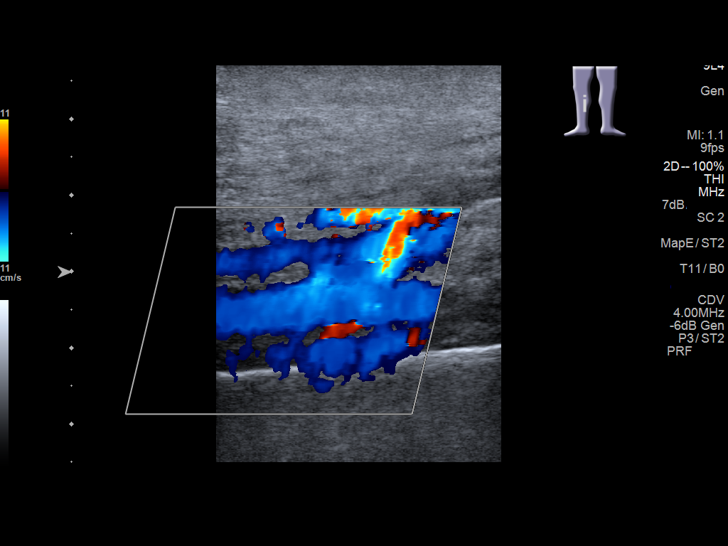

[14 of 24 positions shown; findings below may reference images not displayed]

FINDINGS: Normal compressibility of the common femoral, superficial femoral,
and popliteal veins, as well as the proximal calf veins. No filling
defects to suggest DVT on grayscale or color Doppler imaging.
Doppler waveforms show normal direction of venous flow, normal
respiratory phasicity and response to augmentation. There is
occlusive thrombosis of the right great saphenous vein extending
from above the ankle nearly to the saphenofemoral junction. Survey
views of the contralateral common femoral vein are unremarkable.
IMPRESSION: 1.  No evidence of RIGHT lower extremity deep vein thrombosis.
2. Extensive superficial thrombophlebitis involving the right great
saphenous vein.

## 2023-07-13 DIAGNOSIS — M5442 Lumbago with sciatica, left side: Secondary | ICD-10-CM | POA: Diagnosis not present

## 2023-07-13 DIAGNOSIS — M9902 Segmental and somatic dysfunction of thoracic region: Secondary | ICD-10-CM | POA: Diagnosis not present

## 2023-07-13 DIAGNOSIS — M9903 Segmental and somatic dysfunction of lumbar region: Secondary | ICD-10-CM | POA: Diagnosis not present

## 2023-07-13 DIAGNOSIS — M955 Acquired deformity of pelvis: Secondary | ICD-10-CM | POA: Diagnosis not present

## 2023-07-13 DIAGNOSIS — M9905 Segmental and somatic dysfunction of pelvic region: Secondary | ICD-10-CM | POA: Diagnosis not present

## 2023-07-13 DIAGNOSIS — M546 Pain in thoracic spine: Secondary | ICD-10-CM | POA: Diagnosis not present

## 2023-08-17 DIAGNOSIS — M5442 Lumbago with sciatica, left side: Secondary | ICD-10-CM | POA: Diagnosis not present

## 2023-08-17 DIAGNOSIS — M546 Pain in thoracic spine: Secondary | ICD-10-CM | POA: Diagnosis not present

## 2023-08-17 DIAGNOSIS — M9903 Segmental and somatic dysfunction of lumbar region: Secondary | ICD-10-CM | POA: Diagnosis not present

## 2023-08-17 DIAGNOSIS — M9905 Segmental and somatic dysfunction of pelvic region: Secondary | ICD-10-CM | POA: Diagnosis not present

## 2023-08-17 DIAGNOSIS — M9902 Segmental and somatic dysfunction of thoracic region: Secondary | ICD-10-CM | POA: Diagnosis not present

## 2023-08-17 DIAGNOSIS — M955 Acquired deformity of pelvis: Secondary | ICD-10-CM | POA: Diagnosis not present

## 2023-09-14 DIAGNOSIS — M5442 Lumbago with sciatica, left side: Secondary | ICD-10-CM | POA: Diagnosis not present

## 2023-09-14 DIAGNOSIS — M9902 Segmental and somatic dysfunction of thoracic region: Secondary | ICD-10-CM | POA: Diagnosis not present

## 2023-09-14 DIAGNOSIS — M955 Acquired deformity of pelvis: Secondary | ICD-10-CM | POA: Diagnosis not present

## 2023-09-14 DIAGNOSIS — M9903 Segmental and somatic dysfunction of lumbar region: Secondary | ICD-10-CM | POA: Diagnosis not present

## 2023-09-14 DIAGNOSIS — M9905 Segmental and somatic dysfunction of pelvic region: Secondary | ICD-10-CM | POA: Diagnosis not present

## 2023-09-14 DIAGNOSIS — M546 Pain in thoracic spine: Secondary | ICD-10-CM | POA: Diagnosis not present

## 2023-10-19 DIAGNOSIS — M9905 Segmental and somatic dysfunction of pelvic region: Secondary | ICD-10-CM | POA: Diagnosis not present

## 2023-10-19 DIAGNOSIS — M9902 Segmental and somatic dysfunction of thoracic region: Secondary | ICD-10-CM | POA: Diagnosis not present

## 2023-10-19 DIAGNOSIS — M5442 Lumbago with sciatica, left side: Secondary | ICD-10-CM | POA: Diagnosis not present

## 2023-10-19 DIAGNOSIS — M9903 Segmental and somatic dysfunction of lumbar region: Secondary | ICD-10-CM | POA: Diagnosis not present

## 2023-10-19 DIAGNOSIS — M955 Acquired deformity of pelvis: Secondary | ICD-10-CM | POA: Diagnosis not present

## 2023-10-19 DIAGNOSIS — M546 Pain in thoracic spine: Secondary | ICD-10-CM | POA: Diagnosis not present

## 2023-11-01 DIAGNOSIS — Z6825 Body mass index (BMI) 25.0-25.9, adult: Secondary | ICD-10-CM | POA: Diagnosis not present

## 2023-11-01 DIAGNOSIS — E663 Overweight: Secondary | ICD-10-CM | POA: Diagnosis not present

## 2023-11-01 DIAGNOSIS — E785 Hyperlipidemia, unspecified: Secondary | ICD-10-CM | POA: Diagnosis not present

## 2023-11-01 DIAGNOSIS — Z008 Encounter for other general examination: Secondary | ICD-10-CM | POA: Diagnosis not present

## 2023-11-03 ENCOUNTER — Telehealth: Payer: Self-pay

## 2023-11-03 NOTE — Telephone Encounter (Signed)
 Request for [facility] records was sent to [facility] from [facility]) 11/03/2023

## 2023-11-15 ENCOUNTER — Telehealth: Payer: Self-pay

## 2023-11-15 NOTE — Telephone Encounter (Signed)
 Request for medical records were requested from [facility] for colonoscopy results  [facility]

## 2023-11-16 DIAGNOSIS — M955 Acquired deformity of pelvis: Secondary | ICD-10-CM | POA: Diagnosis not present

## 2023-11-16 DIAGNOSIS — M5442 Lumbago with sciatica, left side: Secondary | ICD-10-CM | POA: Diagnosis not present

## 2023-11-16 DIAGNOSIS — M9905 Segmental and somatic dysfunction of pelvic region: Secondary | ICD-10-CM | POA: Diagnosis not present

## 2023-11-16 DIAGNOSIS — M546 Pain in thoracic spine: Secondary | ICD-10-CM | POA: Diagnosis not present

## 2023-11-16 DIAGNOSIS — M9903 Segmental and somatic dysfunction of lumbar region: Secondary | ICD-10-CM | POA: Diagnosis not present

## 2023-11-16 DIAGNOSIS — M9902 Segmental and somatic dysfunction of thoracic region: Secondary | ICD-10-CM | POA: Diagnosis not present

## 2023-12-14 DIAGNOSIS — M9905 Segmental and somatic dysfunction of pelvic region: Secondary | ICD-10-CM | POA: Diagnosis not present

## 2023-12-14 DIAGNOSIS — M5442 Lumbago with sciatica, left side: Secondary | ICD-10-CM | POA: Diagnosis not present

## 2023-12-14 DIAGNOSIS — M546 Pain in thoracic spine: Secondary | ICD-10-CM | POA: Diagnosis not present

## 2023-12-14 DIAGNOSIS — M9902 Segmental and somatic dysfunction of thoracic region: Secondary | ICD-10-CM | POA: Diagnosis not present

## 2023-12-14 DIAGNOSIS — M955 Acquired deformity of pelvis: Secondary | ICD-10-CM | POA: Diagnosis not present

## 2023-12-14 DIAGNOSIS — M9903 Segmental and somatic dysfunction of lumbar region: Secondary | ICD-10-CM | POA: Diagnosis not present

## 2024-01-11 DIAGNOSIS — M9905 Segmental and somatic dysfunction of pelvic region: Secondary | ICD-10-CM | POA: Diagnosis not present

## 2024-01-11 DIAGNOSIS — M955 Acquired deformity of pelvis: Secondary | ICD-10-CM | POA: Diagnosis not present

## 2024-01-11 DIAGNOSIS — M546 Pain in thoracic spine: Secondary | ICD-10-CM | POA: Diagnosis not present

## 2024-01-11 DIAGNOSIS — M9903 Segmental and somatic dysfunction of lumbar region: Secondary | ICD-10-CM | POA: Diagnosis not present

## 2024-01-11 DIAGNOSIS — M9902 Segmental and somatic dysfunction of thoracic region: Secondary | ICD-10-CM | POA: Diagnosis not present

## 2024-01-11 DIAGNOSIS — M5442 Lumbago with sciatica, left side: Secondary | ICD-10-CM | POA: Diagnosis not present

## 2024-01-18 DIAGNOSIS — K219 Gastro-esophageal reflux disease without esophagitis: Secondary | ICD-10-CM | POA: Diagnosis not present

## 2024-01-18 DIAGNOSIS — I8001 Phlebitis and thrombophlebitis of superficial vessels of right lower extremity: Secondary | ICD-10-CM | POA: Diagnosis not present

## 2024-01-18 DIAGNOSIS — E782 Mixed hyperlipidemia: Secondary | ICD-10-CM | POA: Diagnosis not present

## 2024-01-18 DIAGNOSIS — Z0000 Encounter for general adult medical examination without abnormal findings: Secondary | ICD-10-CM | POA: Diagnosis not present

## 2024-01-18 DIAGNOSIS — Z1331 Encounter for screening for depression: Secondary | ICD-10-CM | POA: Diagnosis not present

## 2024-01-18 DIAGNOSIS — Z125 Encounter for screening for malignant neoplasm of prostate: Secondary | ICD-10-CM | POA: Diagnosis not present

## 2024-01-18 DIAGNOSIS — J3089 Other allergic rhinitis: Secondary | ICD-10-CM | POA: Diagnosis not present

## 2024-02-08 DIAGNOSIS — M9905 Segmental and somatic dysfunction of pelvic region: Secondary | ICD-10-CM | POA: Diagnosis not present

## 2024-02-08 DIAGNOSIS — M546 Pain in thoracic spine: Secondary | ICD-10-CM | POA: Diagnosis not present

## 2024-02-08 DIAGNOSIS — M9902 Segmental and somatic dysfunction of thoracic region: Secondary | ICD-10-CM | POA: Diagnosis not present

## 2024-02-08 DIAGNOSIS — M9903 Segmental and somatic dysfunction of lumbar region: Secondary | ICD-10-CM | POA: Diagnosis not present

## 2024-02-08 DIAGNOSIS — M5442 Lumbago with sciatica, left side: Secondary | ICD-10-CM | POA: Diagnosis not present

## 2024-02-08 DIAGNOSIS — M955 Acquired deformity of pelvis: Secondary | ICD-10-CM | POA: Diagnosis not present

## 2024-03-07 DIAGNOSIS — M5442 Lumbago with sciatica, left side: Secondary | ICD-10-CM | POA: Diagnosis not present

## 2024-03-07 DIAGNOSIS — M546 Pain in thoracic spine: Secondary | ICD-10-CM | POA: Diagnosis not present

## 2024-03-07 DIAGNOSIS — M9905 Segmental and somatic dysfunction of pelvic region: Secondary | ICD-10-CM | POA: Diagnosis not present

## 2024-03-07 DIAGNOSIS — M9903 Segmental and somatic dysfunction of lumbar region: Secondary | ICD-10-CM | POA: Diagnosis not present

## 2024-03-07 DIAGNOSIS — M9902 Segmental and somatic dysfunction of thoracic region: Secondary | ICD-10-CM | POA: Diagnosis not present

## 2024-03-07 DIAGNOSIS — M955 Acquired deformity of pelvis: Secondary | ICD-10-CM | POA: Diagnosis not present

## 2024-04-05 DIAGNOSIS — M546 Pain in thoracic spine: Secondary | ICD-10-CM | POA: Diagnosis not present

## 2024-04-05 DIAGNOSIS — M9905 Segmental and somatic dysfunction of pelvic region: Secondary | ICD-10-CM | POA: Diagnosis not present

## 2024-04-05 DIAGNOSIS — M955 Acquired deformity of pelvis: Secondary | ICD-10-CM | POA: Diagnosis not present

## 2024-04-05 DIAGNOSIS — M9902 Segmental and somatic dysfunction of thoracic region: Secondary | ICD-10-CM | POA: Diagnosis not present

## 2024-04-05 DIAGNOSIS — M5442 Lumbago with sciatica, left side: Secondary | ICD-10-CM | POA: Diagnosis not present

## 2024-04-05 DIAGNOSIS — M9903 Segmental and somatic dysfunction of lumbar region: Secondary | ICD-10-CM | POA: Diagnosis not present
